# Patient Record
Sex: Male | Born: 2000 | Hispanic: No | Marital: Single | Smoking: Never smoker
Health system: Southern US, Community
[De-identification: ages and names within clinical notes are randomized; demographics above are authoritative.]

## PROBLEM LIST (undated history)

## (undated) DIAGNOSIS — K589 Irritable bowel syndrome without diarrhea: Secondary | ICD-10-CM

## (undated) DIAGNOSIS — F419 Anxiety disorder, unspecified: Secondary | ICD-10-CM

## (undated) DIAGNOSIS — F909 Attention-deficit hyperactivity disorder, unspecified type: Secondary | ICD-10-CM

## (undated) HISTORY — DX: Irritable bowel syndrome, unspecified: K58.9

## (undated) HISTORY — DX: Attention-deficit hyperactivity disorder, unspecified type: F90.9

## (undated) HISTORY — DX: Anxiety disorder, unspecified: F41.9

---
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
## 2019-10-14 ENCOUNTER — Ambulatory Visit: Payer: Self-pay | Admitting: Dermatology

## 2020-03-05 ENCOUNTER — Other Ambulatory Visit: Payer: Self-pay | Admitting: Dermatology

## 2020-06-08 ENCOUNTER — Other Ambulatory Visit: Payer: Self-pay | Admitting: Dermatology

## 2020-06-09 ENCOUNTER — Other Ambulatory Visit: Payer: Self-pay | Admitting: Dermatology

## 2020-06-09 DIAGNOSIS — K581 Irritable bowel syndrome with constipation: Secondary | ICD-10-CM

## 2020-06-09 DIAGNOSIS — R109 Unspecified abdominal pain: Secondary | ICD-10-CM

## 2020-06-11 ENCOUNTER — Other Ambulatory Visit: Payer: Self-pay | Admitting: Dermatology

## 2020-07-14 ENCOUNTER — Other Ambulatory Visit: Payer: Self-pay | Admitting: Dermatology

## 2020-07-16 ENCOUNTER — Other Ambulatory Visit: Payer: Self-pay | Admitting: Dermatology

## 2020-08-31 ENCOUNTER — Ambulatory Visit: Payer: Self-pay | Admitting: Dermatology
